# Patient Record
Sex: Male | Born: 2000 | Race: White | Hispanic: No | Marital: Single | Smoking: Never smoker
Health system: Southern US, Community
[De-identification: ages and names within clinical notes are randomized; demographics above are authoritative.]

---
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
## 2020-08-22 ENCOUNTER — Emergency Department (INDEPENDENT_AMBULATORY_CARE_PROVIDER_SITE_OTHER)
Admission: EM | Admit: 2020-08-22 | Discharge: 2020-08-22 | Disposition: A | Discharge status: Home / Self Care | Payer: Medicaid Other | Source: Home / Self Care | Attending: Family Medicine | Admitting: Family Medicine

## 2020-08-22 ENCOUNTER — Other Ambulatory Visit: Payer: Self-pay

## 2020-08-22 DIAGNOSIS — J069 Acute upper respiratory infection, unspecified: Secondary | ICD-10-CM

## 2020-08-22 NOTE — ED Triage Notes (Signed)
Pt presents to Urgent Care with c/o cough, sore throat, and nasal congestion since yesterday.  Pt w/ known COVID exposure; has not been vaccinated.

## 2020-08-22 NOTE — ED Provider Notes (Signed)
[facility]    CSN: [id number] Arrival date & time: 08/22/20  1126      History   Chief Complaint Chief Complaint  Patient presents with  . Cough  . Sore Throat    HPI Brian Grant is a 20 y.o. male.   Yesterday patient developed sore throat, nasal congestion, mild cough, headache, and chills/sweats.  He is concerned about recent COVID19 exposure.  The history is provided by the patient.    History reviewed. No pertinent past medical history.  There are no problems to display for this patient.   History reviewed. No pertinent surgical history.     Home Medications    Prior to Admission medications   Medication Sig Start Date End Date Taking? Authorizing Provider  ibuprofen (ADVIL) 200 MG tablet Take 200 mg by mouth every 6 (six) hours as needed.   Yes [provider]  Pseudoephedrine-APAP-DM (DAYQUIL MULTI-SYMPTOM COLD/FLU PO) Take by mouth.   Yes [provider]    Family History Family History  Problem Relation Age of Onset  . Healthy Mother   . Healthy Father     Social History Social History   Tobacco Use  . Smoking status: Never Smoker  . Smokeless tobacco: Never Used  Vaping Use  . Vaping Use: Every day  Substance Use Topics  . Alcohol use: Yes    Comment: occasionally  . Drug use: Not Currently     Allergies   Patient has no known allergies.   Review of Systems Review of Systems  + sore throat + cough No pleuritic pain No wheezing + nasal congestion + post-nasal drainage No sinus pain/pressure No itchy/red eyes No earache No hemoptysis No SOB No fever, + chills/sweats No nausea No vomiting No abdominal pain No diarrhea No urinary symptoms No skin rash + fatigue No myalgias + headache Used OTC meds (Ibuprofen 400mg ) without relief    Physical Exam Triage Vital Signs ED Triage Vitals  Enc Vitals Group     BP 08/22/20 1149 126/84     Pulse Rate 08/22/20 1149 80     Resp  08/22/20 1149 18     Temp 08/22/20 1149 98.8 F (37.1 C)     Temp src --      SpO2 08/22/20 1149 100 %     Weight 08/22/20 1144 213 lb (96.6 kg)     Height 08/22/20 1144 5\' 6"  (1.676 m)     Head Circumference --      Peak Flow --      Pain Score 08/22/20 1144 8     Pain Loc --      Pain Edu? --      Excl. in GC? --    No data found.  Updated Vital Signs BP 126/84 (BP Location: Right Arm)   Pulse 80   Temp 98.8 F (37.1 C)   Resp 18   Ht 5\' 6"  (1.676 m)   Wt 96.6 kg   SpO2 100%   BMI 34.38 kg/m   Visual Acuity Right Eye Distance:   Left Eye Distance:   Bilateral Distance:    Right Eye Near:   Left Eye Near:    Bilateral Near:     Physical Exam Nursing notes and Vital Signs reviewed. Appearance:  Patient appears stated age, and in no acute distress Eyes:  Pupils are equal, round, and reactive to light and accomodation.  Extraocular movement is intact.  Conjunctivae are not inflamed  Ears:  Canals  normal.  Tympanic membranes normal.  Nose:  Mildly congested turbinates.  No sinus tenderness.   Pharynx:  Normal Neck:  Supple.  No adenopathy  Lungs:  Clear to auscultation.  Breath sounds are equal.  Moving air well. Heart:  Regular rate and rhythm without murmurs, rubs, or gallops.  Abdomen:  Nontender without masses or hepatosplenomegaly.  Bowel sounds are present.  No CVA or flank tenderness.  Extremities:  No edema.  Skin:  No rash present.   UC Treatments / Results  Labs (all labs ordered are listed, but only abnormal results are displayed) Labs Reviewed  NOVEL CORONAVIRUS, NAA    EKG   Radiology No results found.  Procedures Procedures (including critical care time)  Medications Ordered in UC Medications - No data to display  Initial Impression / Assessment and Plan / UC Course  I have reviewed the triage vital signs and the nursing notes.  Pertinent labs & imaging results that were available during my care of the patient were reviewed by me and  considered in my medical decision making (see chart for details).    There is no evidence of bacterial infection today.  Treat symptomatically for now. COVID19 PCR pending. Followup with Family Doctor if not improved in one week.    Final Clinical Impressions(s) / UC Diagnoses   Final diagnoses:  Viral URI with cough     Discharge Instructions     Take plain guaifenesin (1200mg  extended release tabs such as Mucinex) twice daily, with plenty of water, for cough and congestion.  May add Pseudoephedrine (30mg , one or two every 4 to 6 hours) for sinus congestion.  Get adequate rest.   May take Delsym Cough Suppressant ("12 Hour Cough Relief") at bedtime for nighttime cough.  Try warm salt water gargles for sore throat.  Stop all antihistamines for now, and other non-prescription cough/cold preparations. May take Ibuprofen 200mg , 4 tabs every 8 hours with food for fever, headache, etc.  If your COVID-19 test is positive, isolate yourself for five days from the time of your symptom onset or date of testing (if you have no symptoms).  At the end of five days you may end isolation if your symptoms have cleared or improved, and you have not had a fever for 24 hours. At this time you should wear a mask for five more days when you are around others.   If you are unable to wear a mask at work, you should return to work after 10 days.       ED Prescriptions    None        , MD 08/23/20 1800

## 2020-08-22 NOTE — Discharge Instructions (Addendum)
Take plain guaifenesin (1200mg  extended release tabs such as Mucinex) twice daily, with plenty of water, for cough and congestion.  May add Pseudoephedrine (30mg , one or two every 4 to 6 hours) for sinus congestion.  Get adequate rest.   May take Delsym Cough Suppressant ("12 Hour Cough Relief") at bedtime for nighttime cough.  Try warm salt water gargles for sore throat.  Stop all antihistamines for now, and other non-prescription cough/cold preparations. May take Ibuprofen 200mg , 4 tabs every 8 hours with food for fever, headache, etc.  If your COVID-19 test is positive, isolate yourself for five days from the time of your symptom onset or date of testing (if you have no symptoms).  At the end of five days you may end isolation if your symptoms have cleared or improved, and you have not had a fever for 24 hours. At this time you should wear a mask for five more days when you are around others.   If you are unable to wear a mask at work, you should return to work after 10 days.

## 2020-08-25 LAB — NOVEL CORONAVIRUS, NAA: SARS-CoV-2, NAA: DETECTED — AB

## 2020-08-26 ENCOUNTER — Telehealth: Payer: Self-pay | Admitting: Unknown Physician Specialty

## 2020-08-26 NOTE — Telephone Encounter (Signed)
Called to discuss with patient about COVID-19 symptoms and the use of one of the available treatments for those with mild to moderate Covid symptoms and at a high risk of hospitalization.  Pt appears to qualify for outpatient treatment due to co-morbid conditions and/or a member of an at-risk group in accordance with the FDA Emergency Use Authorization.     Unable to reach pt - LMOM  Brian Grant
